# Patient Record
Sex: Male | Born: 1980 | Race: White | Hispanic: No | Marital: Single | Smoking: Never smoker
Health system: Southern US, Community
[De-identification: ages and names within clinical notes are randomized; demographics above are authoritative.]

---

## 2020-07-13 ENCOUNTER — Other Ambulatory Visit: Payer: Self-pay

## 2020-07-13 ENCOUNTER — Emergency Department: Payer: Managed Care, Other (non HMO)

## 2020-07-13 ENCOUNTER — Encounter: Payer: Self-pay | Admitting: Emergency Medicine

## 2020-07-13 ENCOUNTER — Emergency Department
Admission: EM | Admit: 2020-07-13 | Discharge: 2020-07-13 | Disposition: A | Discharge status: Home / Self Care | Payer: Managed Care, Other (non HMO) | Attending: Emergency Medicine | Admitting: Emergency Medicine

## 2020-07-13 DIAGNOSIS — R112 Nausea with vomiting, unspecified: Secondary | ICD-10-CM | POA: Diagnosis not present

## 2020-07-13 DIAGNOSIS — R109 Unspecified abdominal pain: Secondary | ICD-10-CM | POA: Diagnosis present

## 2020-07-13 DIAGNOSIS — N201 Calculus of ureter: Secondary | ICD-10-CM | POA: Diagnosis not present

## 2020-07-13 LAB — COMPREHENSIVE METABOLIC PANEL
ALT: 30 U/L (ref 0–44)
AST: 28 U/L (ref 15–41)
Albumin: 4.5 g/dL (ref 3.5–5.0)
Alkaline Phosphatase: 65 U/L (ref 38–126)
Anion gap: 11 (ref 5–15)
BUN: 14 mg/dL (ref 6–20)
CO2: 23 mmol/L (ref 22–32)
Calcium: 9.2 mg/dL (ref 8.9–10.3)
Chloride: 104 mmol/L (ref 98–111)
Creatinine, Ser: 1.32 mg/dL — ABNORMAL HIGH (ref 0.61–1.24)
GFR calc Af Amer: >60 mL/min (ref >60)
GFR calc non Af Amer: >60 mL/min (ref >60)
Glucose, Bld: 105 mg/dL — ABNORMAL HIGH (ref 70–99)
Potassium: 3.7 mmol/L (ref 3.5–5.1)
Sodium: 138 mmol/L (ref 135–145)
Total Bilirubin: 1.3 mg/dL — ABNORMAL HIGH (ref 0.3–1.2)
Total Protein: 7.7 g/dL (ref 6.5–8.1)

## 2020-07-13 LAB — CBC WITH DIFFERENTIAL/PLATELET
Abs Immature Granulocytes: 0.02 10*3/uL (ref 0.00–0.07)
Basophils Absolute: 0.1 10*3/uL (ref 0.0–0.1)
Basophils Relative: 1 %
Eosinophils Absolute: 0.1 10*3/uL (ref 0.0–0.5)
Eosinophils Relative: 2 %
HCT: 43.7 % (ref 39.0–52.0)
Hemoglobin: 15.4 g/dL (ref 13.0–17.0)
Immature Granulocytes: 0 %
Lymphocytes Relative: 37 %
Lymphs Abs: 2.3 10*3/uL (ref 0.7–4.0)
MCH: 30.1 pg (ref 26.0–34.0)
MCHC: 35.2 g/dL (ref 30.0–36.0)
MCV: 85.5 fL (ref 80.0–100.0)
Monocytes Absolute: 0.5 10*3/uL (ref 0.1–1.0)
Monocytes Relative: 8 %
Neutro Abs: 3.1 10*3/uL (ref 1.7–7.7)
Neutrophils Relative %: 52 %
Platelets: 298 10*3/uL (ref 150–400)
RBC: 5.11 MIL/uL (ref 4.22–5.81)
RDW: 12 % (ref 11.5–15.5)
WBC: 6 10*3/uL (ref 4.0–10.5)
nRBC: 0 % (ref 0.0–0.2)

## 2020-07-13 LAB — URINALYSIS, COMPLETE (UACMP) WITH MICROSCOPIC
Bacteria, UA: NONE SEEN
Bilirubin Urine: NEGATIVE
Glucose, UA: NEGATIVE mg/dL
Ketones, ur: NEGATIVE mg/dL
Leukocytes,Ua: NEGATIVE
Nitrite: NEGATIVE
Protein, ur: NEGATIVE mg/dL
Specific Gravity, Urine: 1.012 (ref 1.005–1.030)
pH: 7 (ref 5.0–8.0)

## 2020-07-13 MED ORDER — ONDANSETRON 4 MG PO TBDP
4.0000 mg | ORAL_TABLET | Freq: Once | ORAL | Status: AC
  Administered 2020-07-13: 4 mg via ORAL
  Filled 2020-07-13: qty 1

## 2020-07-13 MED ORDER — TAMSULOSIN HCL 0.4 MG PO CAPS: 0.4000 mg | ORAL_CAPSULE | Freq: Every day | ORAL | 0 refills | Status: AC

## 2020-07-13 MED ORDER — ONDANSETRON 4 MG PO TBDP: ORAL_TABLET | ORAL | 0 refills | Status: AC

## 2020-07-13 MED ORDER — FENTANYL CITRATE (PF) 100 MCG/2ML IJ SOLN: 100.0000 ug | Freq: Once | INTRAMUSCULAR | Status: DC

## 2020-07-13 MED ORDER — OXYCODONE-ACETAMINOPHEN 7.5-325 MG PO TABS: 1.0000 | ORAL_TABLET | Freq: Four times a day (QID) | ORAL | 0 refills | Status: AC | PRN

## 2020-07-13 MED ORDER — OXYCODONE-ACETAMINOPHEN 5-325 MG PO TABS
1.0000 | ORAL_TABLET | Freq: Once | ORAL | Status: AC
  Administered 2020-07-13: 1 via ORAL
  Filled 2020-07-13: qty 1

## 2020-07-13 NOTE — Discharge Instructions (Signed)
Follow-up with urologist close to your home or you may make an appointment with Dr. Richardo Hanks who is the urologist on call for [place].  Increase fluids.  Begin taking medication as directed.  The Zofran is 1 or 2 every 8 hours if needed for nausea and vomiting.  The Percocet as needed for pain.  The Flomax is 1 daily.  If you began having worsening of your symptoms and including fever and chills go to the nearest emergency department.  Do not drive or operate machinery while taking the pain medication as it could cause drowsiness.

## 2020-07-13 NOTE — ED Provider Notes (Signed)
[facility] Emergency Department Provider Note  ____________________________________________   None    (approximate)  I have reviewed the triage vital signs and the nursing notes.   HISTORY  Chief Complaint Flank Pain   HPI Richard Valencia is a 39 y.o. male presents with complaint of left abdominal/flank pain that started suddenly at 5 AM.  Patient initially had some nausea with vomiting.  He denies any fever, chills, previous kidney stones or UTIs.  Patient was given pain medication and Zofran for nausea prior to being seen.      History reviewed. No pertinent past medical history.  There are no problems to display for this patient.   History reviewed. No pertinent surgical history.  Prior to Admission medications   Medication Sig Start Date End Date Taking? Authorizing Provider  ondansetron (ZOFRAN ODT) 4 MG disintegrating tablet 1 or 2 every 8 hours if needed for nausea and vomiting 07/13/20   Tommi Rumps, PA-C  oxyCODONE-acetaminophen (PERCOCET) 7.5-325 MG tablet Take 1 tablet by mouth every 6 (six) hours as needed for severe pain. 07/13/20 07/13/21  Tommi Rumps, PA-C  tamsulosin (FLOMAX) 0.4 MG CAPS capsule Take 1 capsule (0.4 mg total) by mouth daily. 07/13/20   Tommi Rumps, PA-C    Allergies Patient has no known allergies.  No family history on file.  Social History Social History   Tobacco Use  . Smoking status: Never Smoker  . Smokeless tobacco: Never Used  Vaping Use  . Vaping Use: Never used  Substance Use Topics  . Alcohol use: Not on file  . Drug use: Not on file    Review of Systems Constitutional: No fever/chills Eyes: No visual changes. ENT: No sore throat. Cardiovascular: Denies chest pain. Respiratory: Denies shortness of breath. Gastrointestinal: No abdominal pain.  Positive nausea, no vomiting.  Positive left leg pain. Genitourinary: Negative for dysuria. Musculoskeletal: Negative for back pain. Skin:  Negative for rash. Neurological: Negative for headaches, focal weakness or numbness.   ____________________________________________   PHYSICAL EXAM:  VITAL SIGNS: ED Triage Vitals  Enc Vitals Group     BP 07/13/20 0641 131/79     Pulse Rate 07/13/20 0641 61     Resp 07/13/20 0641 18     Temp 07/13/20 0641 98.2 F (36.8 C)     Temp Source 07/13/20 0641 Oral     SpO2 07/13/20 0641 100 %     Weight 07/13/20 0638 204 lb (92.5 kg)     Height 07/13/20 0638 5\' 9"  (1.753 m)     Head Circumference --      Peak Flow --      Pain Score 07/13/20 0638 5     Pain Loc --      Pain Edu? --      Excl. in GC? --     Constitutional: Alert and oriented. Well appearing and in no acute distress. Eyes: Conjunctivae are normal. PERRL. EOMI. Head: Atraumatic. Neck: No stridor.   Cardiovascular: Normal rate, regular rhythm. Grossly normal heart sounds.  Good peripheral circulation. Respiratory: Normal respiratory effort.  No retractions. Lungs CTAB. Gastrointestinal: Soft and nontender. No distention.  Mild left CVA tenderness. Musculoskeletal: No lower extremity tenderness nor edema.   Neurologic:  Normal speech and language. No gross focal neurologic deficits are appreciated. No gait instability. Skin:  Skin is warm, dry and intact. No rash noted. Psychiatric: Mood and affect are normal. Speech and behavior are normal.  ____________________________________________   LABS (all labs ordered  are listed, but only abnormal results are displayed)  Labs Reviewed  COMPREHENSIVE METABOLIC PANEL - Abnormal; Notable for the following components:      Result Value   Glucose, Bld 105 (*)    Creatinine, Ser 1.32 (*)    Total Bilirubin 1.3 (*)    All other components within normal limits  URINALYSIS, COMPLETE (UACMP) WITH MICROSCOPIC - Abnormal; Notable for the following components:   Color, Urine YELLOW (*)    APPearance CLEAR (*)    Hgb urine dipstick MODERATE (*)    All other components within  normal limits  CBC WITH DIFFERENTIAL/PLATELET    RADIOLOGY   Official radiology report(s): CT Renal Stone Study  Result Date: 07/13/2020 CLINICAL DATA:  Left flank pain, vomiting EXAM: CT ABDOMEN AND PELVIS WITHOUT CONTRAST TECHNIQUE: Multidetector CT imaging of the abdomen and pelvis was performed following the standard protocol without IV contrast. COMPARISON:  None. FINDINGS: Lower chest: No acute abnormality. Hepatobiliary: No focal liver abnormality is seen. No gallstones, gallbladder wall thickening, or biliary dilatation. Pancreas: Unremarkable. No pancreatic ductal dilatation or surrounding inflammatory changes. Spleen: Normal in size without focal abnormality. Adrenals/Urinary Tract: Unremarkable adrenal glands. 4 x 3 mm stone located at the left ureteropelvic junction (series 2, image 41) resulting in mild left hydronephrosis. There are multiple additional bilateral punctate 2-3 mm renal calculi. There is no right-sided hydronephrosis. The right ureter is unremarkable. Urinary bladder is normal in appearance. Within the midpole of the right kidney there is a 9 mm low-density lesion with internal density of 12 HU compatible with a cyst (series 2, image 35). Stomach/Bowel: Stomach is within normal limits. Appendix appears normal. No evidence of bowel wall thickening, distention, or inflammatory changes. Vascular/Lymphatic: No significant vascular findings are present. No enlarged abdominal or pelvic lymph nodes. Reproductive: Prostate is unremarkable. Other: No free fluid. No abdominopelvic fluid collection. No pneumoperitoneum. Small fat containing umbilical hernia. Musculoskeletal: No acute or significant osseous findings. IMPRESSION: 1. Obstructing 4 x 3 mm stone at the left ureteropelvic junction resulting in mild left hydronephrosis. 2. Multiple additional bilateral punctate 2-3 mm renal calculi. 3. Small fat containing umbilical hernia. Electronically Signed   By: Duanne Guess D.O.   On:  07/13/2020 08:15    ____________________________________________   PROCEDURES  Procedure(s) performed (including Critical Care):  Procedures   ____________________________________________   INITIAL IMPRESSION / ASSESSMENT AND PLAN / ED COURSE  As part of my medical decision making, I reviewed the following data within the electronic MEDICAL RECORD NUMBER Notes from prior ED visits and  Controlled Substance Database  Richard Valencia was evaluated in Emergency Department on 07/13/2020 for the symptoms described in the history of present illness. He was evaluated in the context of the global COVID-19 pandemic, which necessitated consideration that the patient might be at risk for infection with the SARS-CoV-2 virus that causes COVID-19. Institutional protocols and algorithms that pertain to the evaluation of patients at risk for COVID-19 are in a state of rapid change based on information released by regulatory bodies including the CDC and federal and state organizations. These policies and algorithms were followed during the patient's care in the ED.  39 year old male presents to the ED with complaint of left flank pain that started suddenly.  Patient also has some nausea.  He denies any previous UTIs or history of kidney stones.  States that the pain comes in waves.  He denies any injury to his back.  Urinalysis showed RBCs and remaining lab work was essentially unremarkable.  CT scan does show a 4 x 3 mm stone on the left.  Patient was made aware that he may have difficulty passing it and because he lives in [place] he should follow-up with a urologist.  He was also given a referral to urologist in this area if he is unable to get in touch with a urologist closer to his house.  Patient denied any continued nausea and had no vomiting prior to discharge.  Patient was discharged with prescription for Percocet, Zofran and Flomax.  He is also aware that he cannot drive or operate machinery while taking the  Percocet.  He is to increase fluids.  He is to follow-up with urology and if any severe worsening of his symptoms or fever, chills, nausea or vomiting he is to go to the nearest emergency department.  ____________________________________________   FINAL CLINICAL IMPRESSION(S) / ED DIAGNOSES  Final diagnoses:  Ureterolithiasis  Left flank pain     ED Discharge Orders         Ordered    oxyCODONE-acetaminophen (PERCOCET) 7.5-325 MG tablet  Every 6 hours PRN        07/13/20 1013    ondansetron (ZOFRAN ODT) 4 MG disintegrating tablet        07/13/20 1013    tamsulosin (FLOMAX) 0.4 MG CAPS capsule  Daily        07/13/20 1013           Note:  This document was prepared using Dragon voice recognition software and may include unintentional dictation errors.    Tommi Rumps, PA-C 07/13/20 1535    Minna Antis, MD 07/14/20 2021

## 2020-07-13 NOTE — ED Notes (Signed)
Informed patient that Dr. Lenard Lance had reviewed chart and ordered CT scan and additional pain medication.  Patient agrees to CT scan, but wishes to hold off of additional pain medication at this time.

## 2020-07-13 NOTE — ED Triage Notes (Signed)
Pt to triage via w/c, appears uncomfortable; st at 5am had sudden onset left side/flank pain accomp by N/V

## 2020-07-13 NOTE — ED Notes (Signed)
See triage note  Presents with left groin pain/flank pain  Positive n/v this am    No fever  States he did have some relief with pain meds but now pain is returning

## 2022-04-29 IMAGING — CT CT RENAL STONE PROTOCOL
2 of 4 series · 16 of 46 positions shown, 18 images · non-contrast
Comparison: None.

CLINICAL DATA: Left flank pain, vomiting

EXAM:
CT ABDOMEN AND PELVIS WITHOUT CONTRAST
TECHNIQUE: Multidetector CT imaging of the abdomen and pelvis was performed
following the standard protocol without IV contrast.

[Series 2: stone full standard · axial · 0.73mm/px · z∈[-500,-50]mm · 13 of 100 slices shown, 15 images]
[im 5/100  soft-tissue]
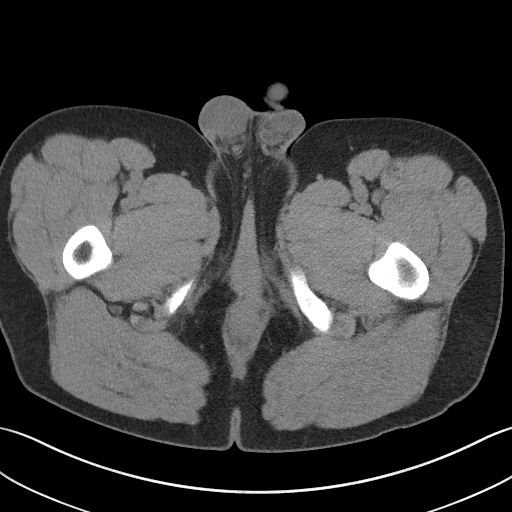
[im 5/100  bone]
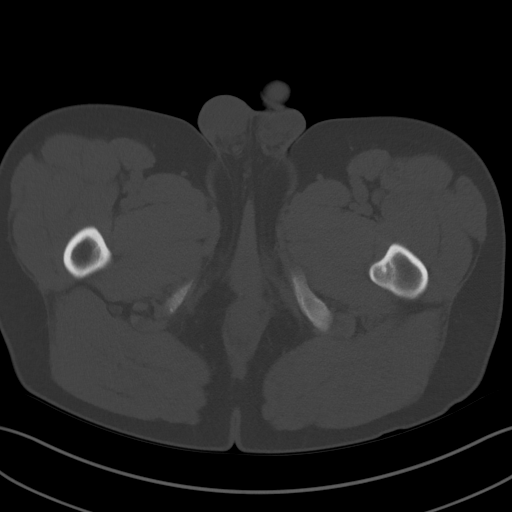
[im 13/100  soft-tissue]
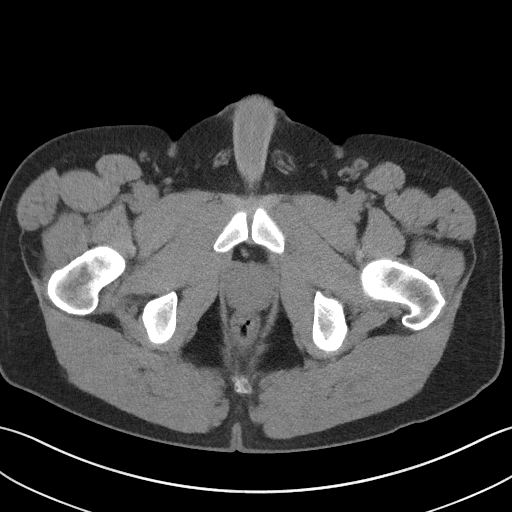
[im 21/100  soft-tissue]
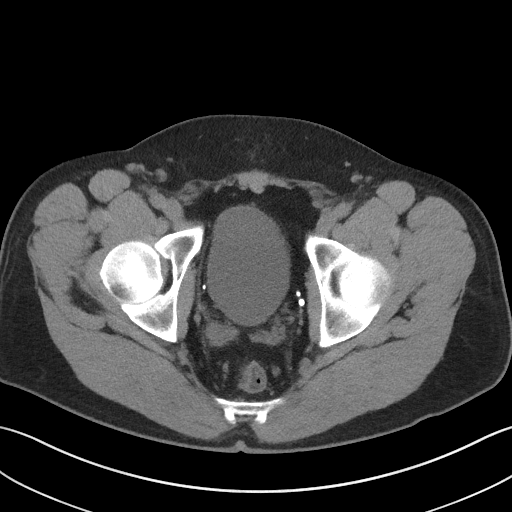
[im 29/100  soft-tissue]
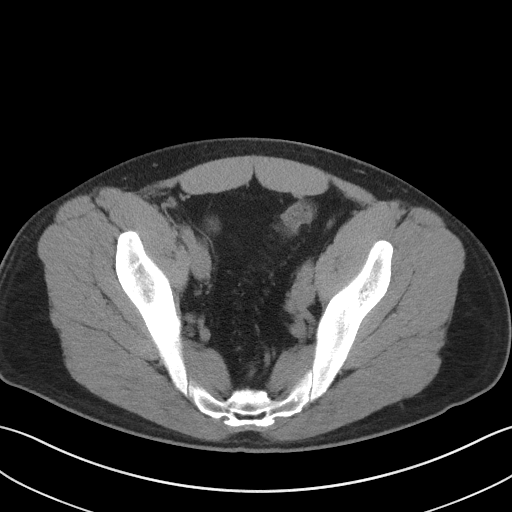
[im 34/100  soft-tissue]
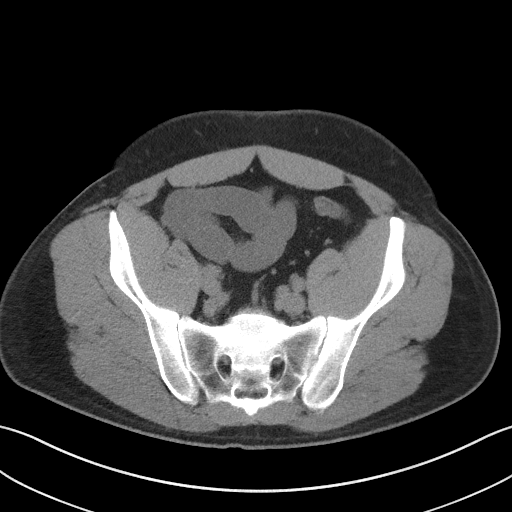
[im 42/100  soft-tissue]
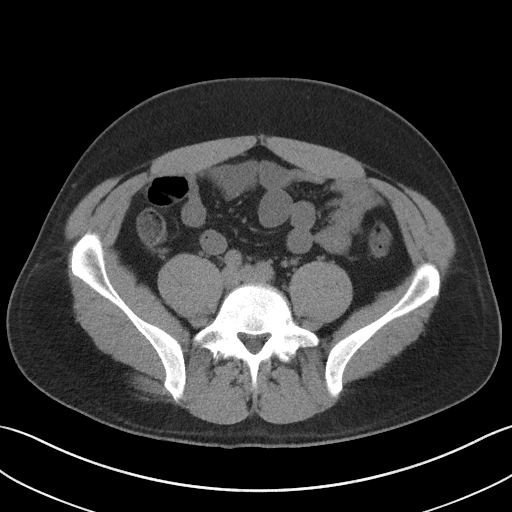
[im 50/100  soft-tissue]
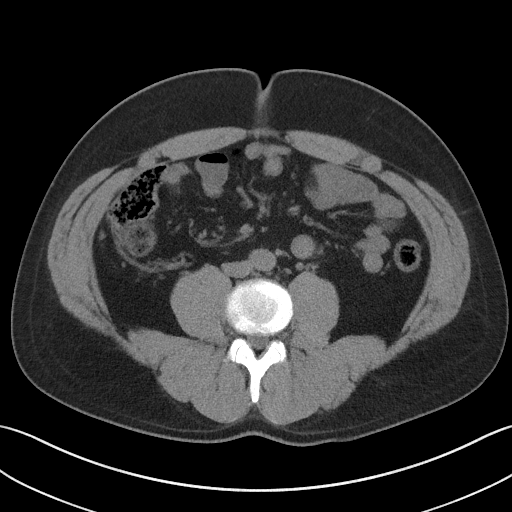
[im 58/100  soft-tissue]
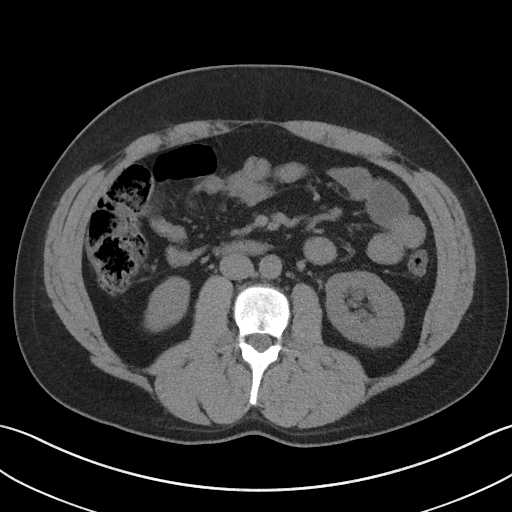
[im 67/100  soft-tissue]
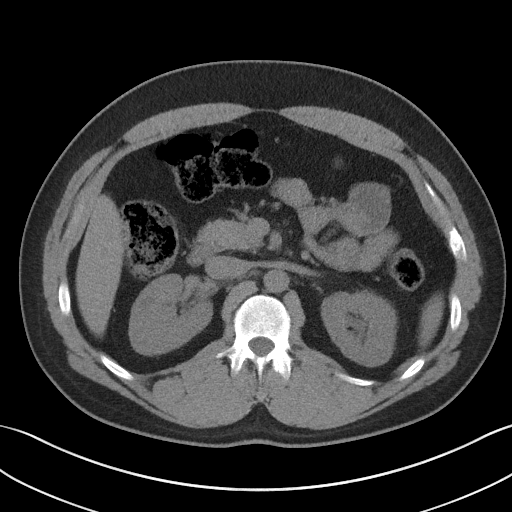
[im 67/100  bone]
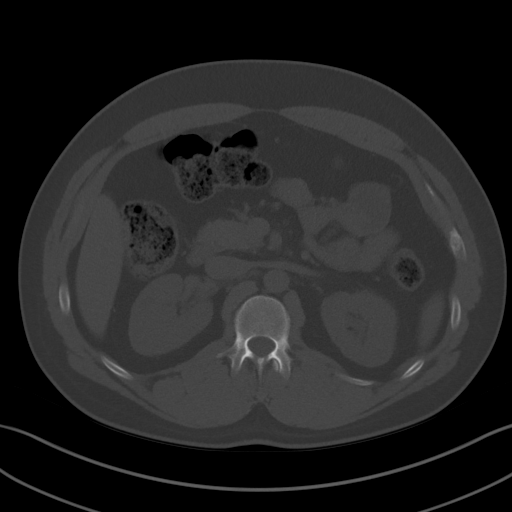
[im 71/100  soft-tissue]
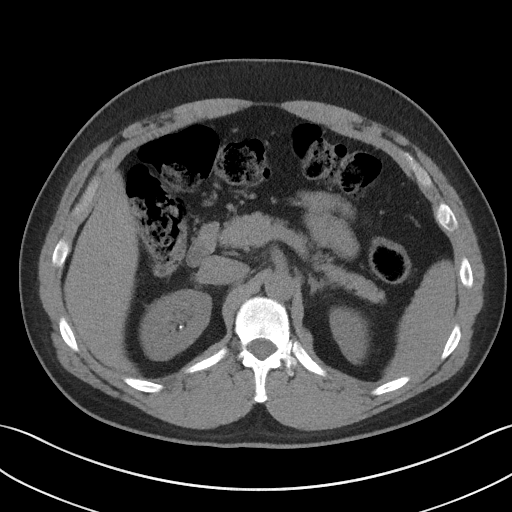
[im 79/100  soft-tissue]
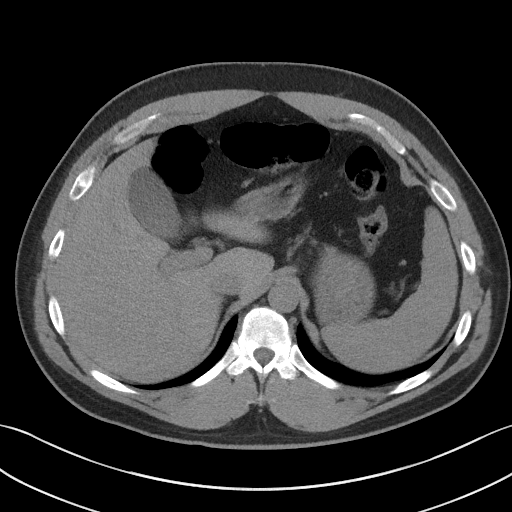
[im 87/100  soft-tissue]
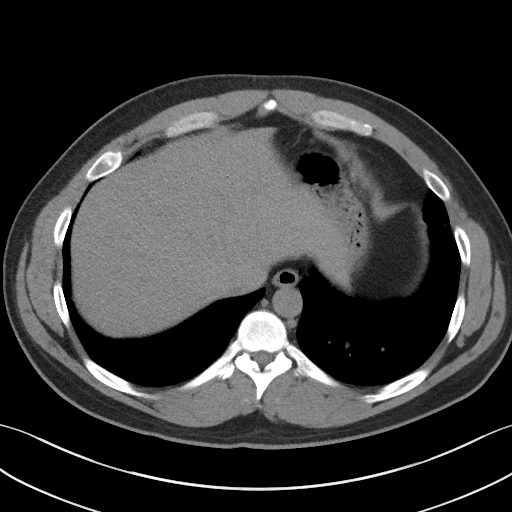
[im 95/100  soft-tissue]
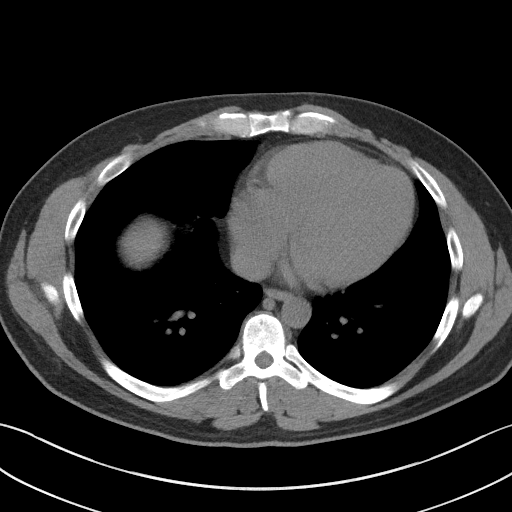

[Series 5: coronal · coronal · 0.73mm/px · 3 of 146 slices shown]
[im 49/146  soft-tissue]
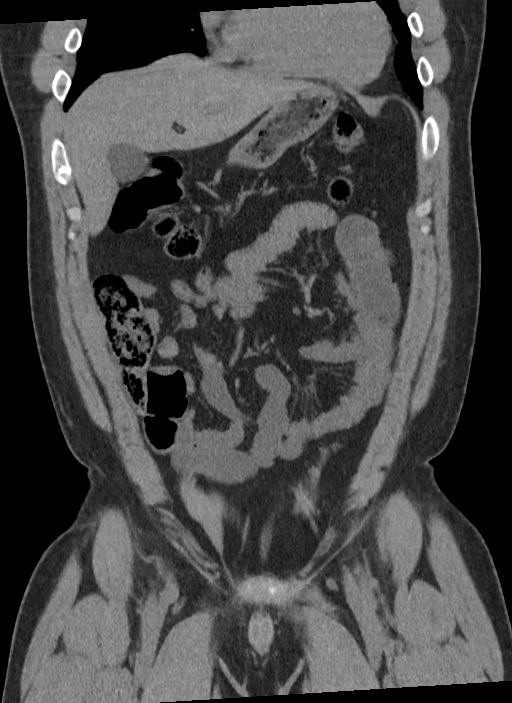
[im 65/146  soft-tissue]
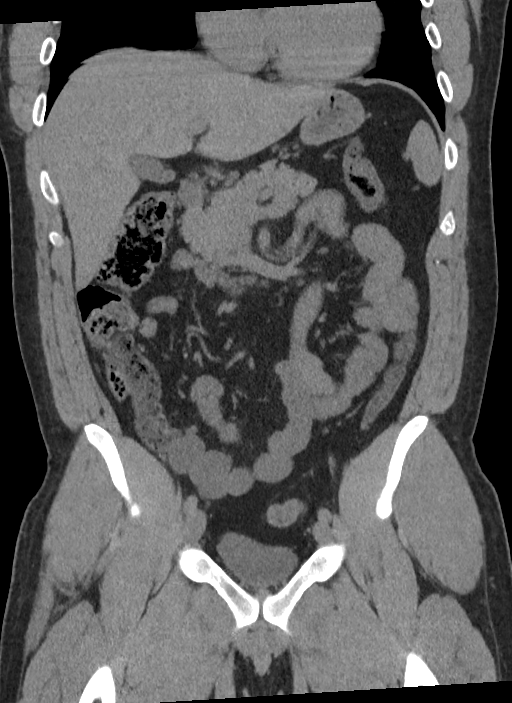
[im 81/146  soft-tissue]
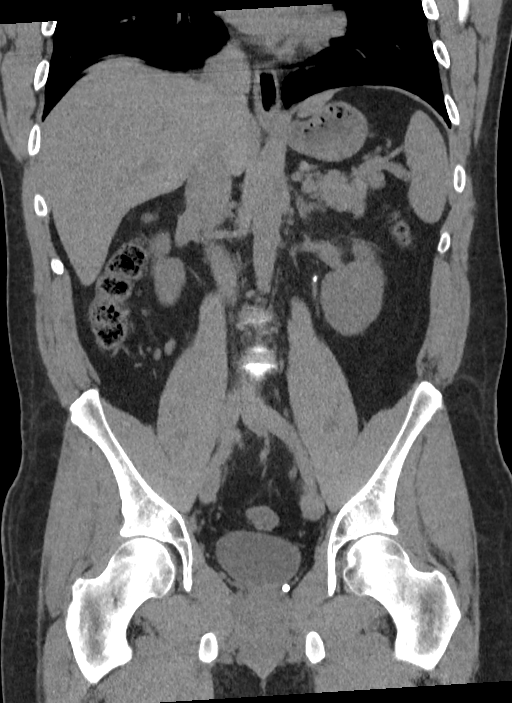

[16 of 46 positions shown; findings below may reference images not displayed]

FINDINGS: Lower chest: No acute abnormality.

Hepatobiliary: No focal liver abnormality is seen. No gallstones,
gallbladder wall thickening, or biliary dilatation.

Pancreas: Unremarkable. No pancreatic ductal dilatation or
surrounding inflammatory changes.

Spleen: Normal in size without focal abnormality.

Adrenals/Urinary Tract: Unremarkable adrenal glands. 4 x 3 mm stone
located at the left ureteropelvic junction (series 2, image 41)
resulting in mild left hydronephrosis. There are multiple additional
bilateral punctate 2-3 mm renal calculi. There is no right-sided
hydronephrosis. The right ureter is unremarkable. Urinary bladder is
normal in appearance. Within the midpole of the right kidney there
is a 9 mm low-density lesion with internal density of 12 HU
compatible with a cyst (series 2, image 35).

Stomach/Bowel: Stomach is within normal limits. Appendix appears
normal. No evidence of bowel wall thickening, distention, or
inflammatory changes.

Vascular/Lymphatic: No significant vascular findings are present. No
enlarged abdominal or pelvic lymph nodes.

Reproductive: Prostate is unremarkable.

Other: No free fluid. No abdominopelvic fluid collection. No
pneumoperitoneum. Small fat containing umbilical hernia.

Musculoskeletal: No acute or significant osseous findings.
IMPRESSION: 1. Obstructing 4 x 3 mm stone at the left ureteropelvic junction
resulting in mild left hydronephrosis.
2. Multiple additional bilateral punctate 2-3 mm renal calculi.
3. Small fat containing umbilical hernia.
